# Patient Record
Sex: Male | Born: 1970 | Race: White | Hispanic: No | Marital: Single | Smoking: Never smoker
Health system: Southern US, Community
[De-identification: ages and names within clinical notes are randomized; demographics above are authoritative.]

## PROBLEM LIST (undated history)

## (undated) DIAGNOSIS — K635 Polyp of colon: Secondary | ICD-10-CM

## (undated) DIAGNOSIS — K219 Gastro-esophageal reflux disease without esophagitis: Secondary | ICD-10-CM

## (undated) DIAGNOSIS — E785 Hyperlipidemia, unspecified: Secondary | ICD-10-CM

## (undated) HISTORY — DX: Hyperlipidemia, unspecified: E78.5

## (undated) HISTORY — DX: Polyp of colon: K63.5

## (undated) HISTORY — DX: Gastro-esophageal reflux disease without esophagitis: K21.9

---

## 1998-12-10 ENCOUNTER — Emergency Department (HOSPITAL_COMMUNITY): Admission: EM | Admit: 1998-12-10 | Discharge: 1998-12-10 | Payer: Self-pay | Admitting: Emergency Medicine

## 2001-08-21 ENCOUNTER — Emergency Department (HOSPITAL_COMMUNITY): Admission: EM | Admit: 2001-08-21 | Discharge: 2001-08-21 | Payer: Self-pay | Admitting: Emergency Medicine

## 2001-08-21 ENCOUNTER — Encounter: Payer: Self-pay | Admitting: Emergency Medicine

## 2003-06-18 ENCOUNTER — Emergency Department (HOSPITAL_COMMUNITY): Admission: EM | Admit: 2003-06-18 | Discharge: 2003-06-18 | Payer: Self-pay | Admitting: *Deleted

## 2004-05-19 ENCOUNTER — Ambulatory Visit: Payer: Self-pay | Admitting: Internal Medicine

## 2005-05-31 IMAGING — CR DG ANKLE COMPLETE 3+V*L*
2 series · 2 of 2 positions shown · non-contrast
Comparison: none

CLINICAL DATA: Pain and swelling after a twisting injury.  
 LEFT ANKLE (THREE VIEW)
 There is soft tissue anteriorly and laterally but there is no evidence of fracture or dislocation or other acute bony abnormality.  
 IMPRESSION
 No bony abnormality.

[view not recorded (1 of 2)]
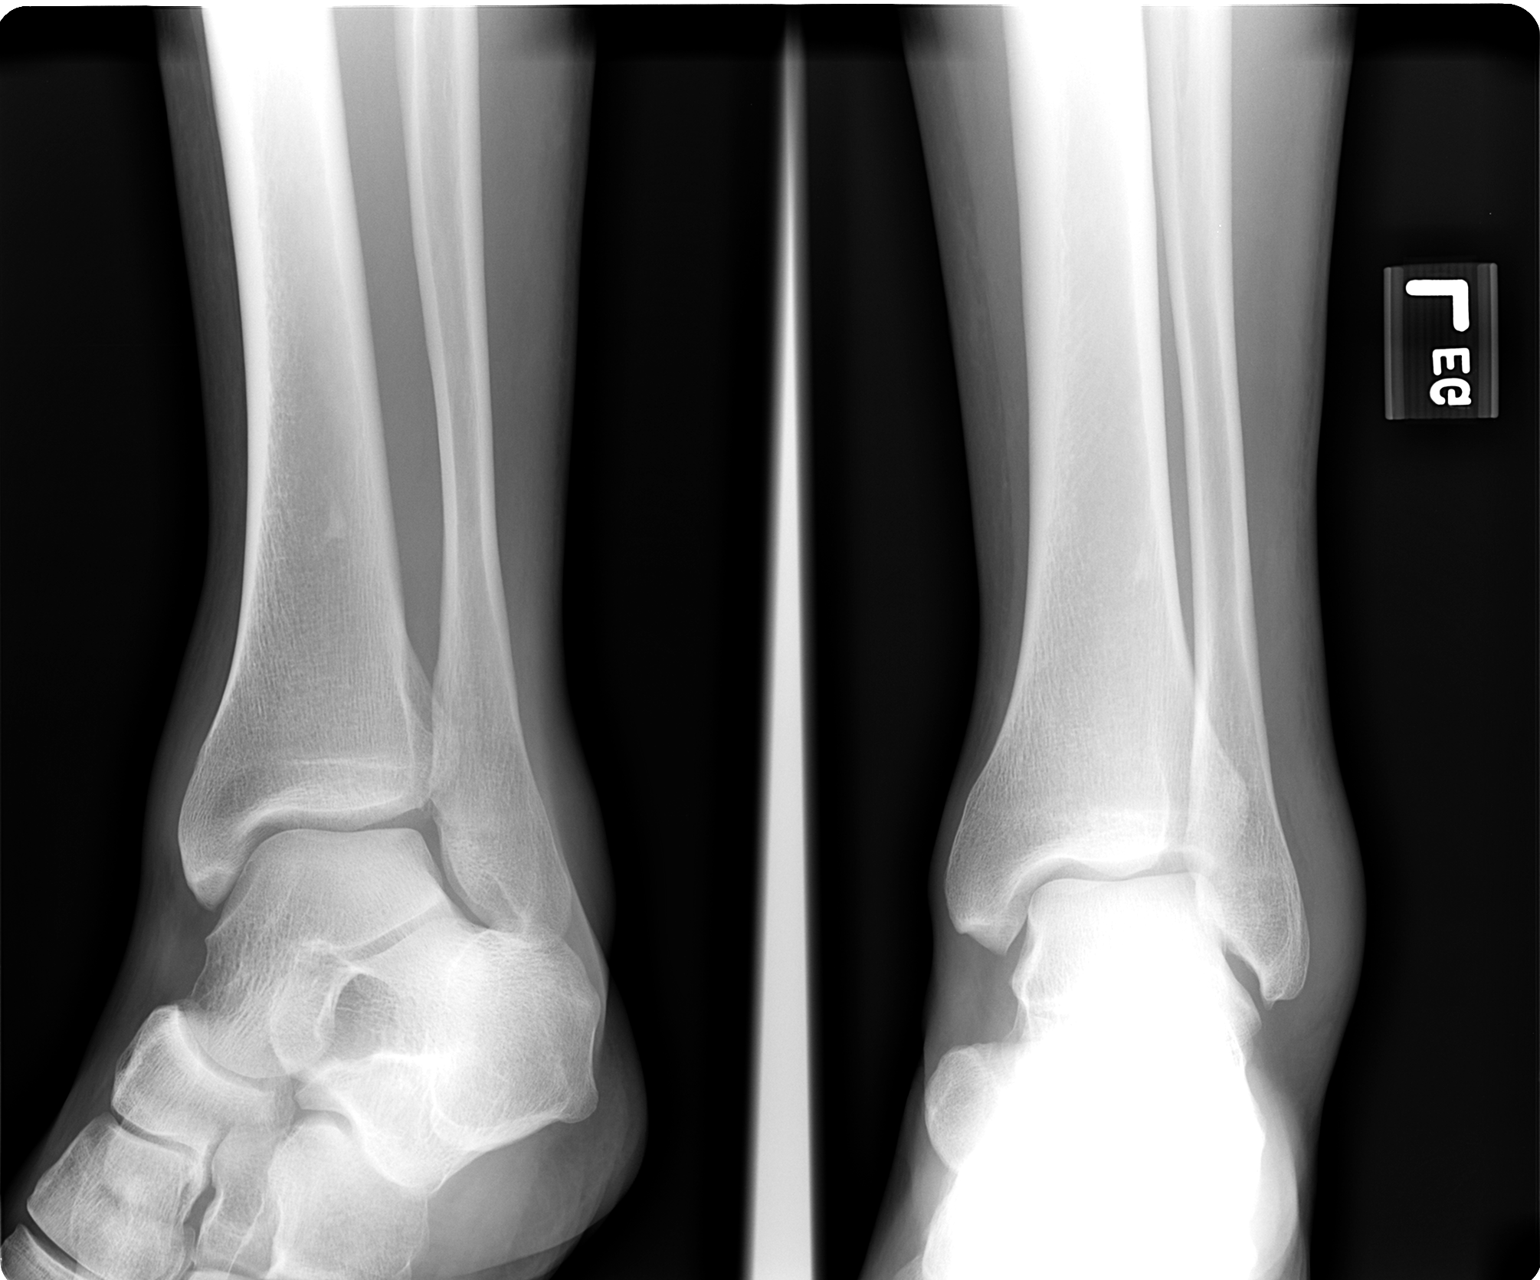

[view not recorded (2 of 2)]
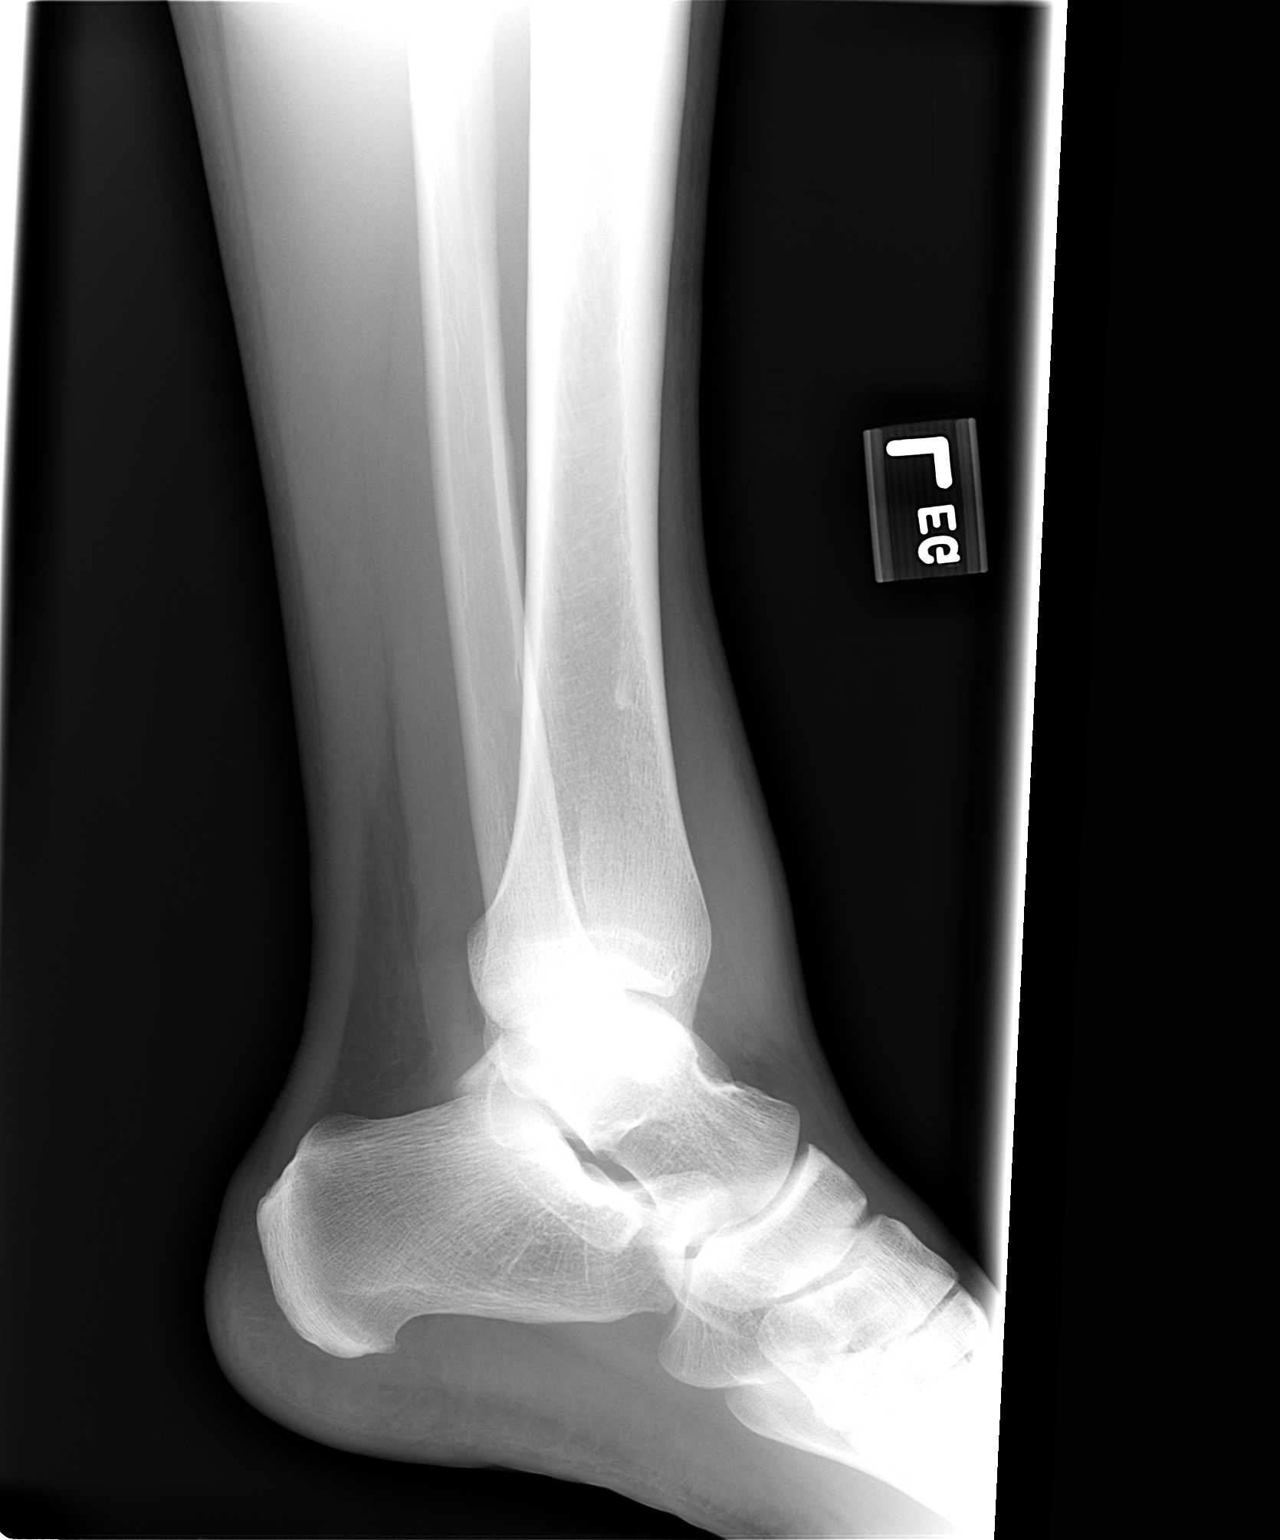

[2 of 2 positions shown; findings below may reference images not displayed]

## 2009-09-23 ENCOUNTER — Ambulatory Visit: Payer: Self-pay | Admitting: Internal Medicine

## 2009-09-23 LAB — CONVERTED CEMR LAB
ALT: 31 units/L (ref 0–53)
Alkaline Phosphatase: 63 units/L (ref 39–117)
Basophils Relative: 0.7 % (ref 0.0–3.0)
Bilirubin Urine: NEGATIVE
Bilirubin, Direct: 0.1 mg/dL (ref 0.0–0.3)
Blood in Urine, dipstick: NEGATIVE
Calcium: 9.4 mg/dL (ref 8.4–10.5)
Chloride: 104 meq/L (ref 96–112)
Cholesterol: 298 mg/dL — ABNORMAL HIGH (ref 0–200)
Creatinine, Ser: 1 mg/dL (ref 0.4–1.5)
Eosinophils Relative: 1.5 % (ref 0.0–5.0)
GFR calc non Af Amer: 88.38 mL/min (ref >60)
Glucose, Urine, Semiquant: NEGATIVE
Ketones, urine, test strip: NEGATIVE
Lymphocytes Relative: 30.1 % (ref 12.0–46.0)
Monocytes Relative: 8 % (ref 3.0–12.0)
Neutrophils Relative %: 59.7 % (ref 43.0–77.0)
Nitrite: NEGATIVE
Protein, U semiquant: NEGATIVE
RBC: 5.16 M/uL (ref 4.22–5.81)
Specific Gravity, Urine: 1.02
Total CHOL/HDL Ratio: 8
Total Protein: 6.8 g/dL (ref 6.0–8.3)
Triglycerides: 495 mg/dL — ABNORMAL HIGH (ref 0.0–149.0)
Urobilinogen, UA: 0.2
VLDL: 99 mg/dL — ABNORMAL HIGH (ref 0.0–40.0)
WBC Urine, dipstick: NEGATIVE
WBC: 7.2 10*3/uL (ref 4.5–10.5)
pH: 5

## 2009-10-15 ENCOUNTER — Ambulatory Visit: Payer: Self-pay | Admitting: Internal Medicine

## 2009-10-15 ENCOUNTER — Encounter: Payer: Self-pay | Admitting: Internal Medicine

## 2009-10-15 DIAGNOSIS — R040 Epistaxis: Secondary | ICD-10-CM | POA: Insufficient documentation

## 2009-10-15 DIAGNOSIS — D485 Neoplasm of uncertain behavior of skin: Secondary | ICD-10-CM | POA: Insufficient documentation

## 2009-10-15 DIAGNOSIS — E781 Pure hyperglyceridemia: Secondary | ICD-10-CM | POA: Insufficient documentation

## 2009-10-15 DIAGNOSIS — R209 Unspecified disturbances of skin sensation: Secondary | ICD-10-CM | POA: Insufficient documentation

## 2009-10-29 ENCOUNTER — Encounter: Payer: Self-pay | Admitting: Internal Medicine

## 2009-10-30 ENCOUNTER — Ambulatory Visit: Payer: Self-pay

## 2009-10-30 ENCOUNTER — Encounter: Payer: Self-pay | Admitting: Internal Medicine

## 2009-11-03 ENCOUNTER — Encounter: Payer: Self-pay | Admitting: Internal Medicine

## 2009-11-03 ENCOUNTER — Encounter: Payer: Self-pay | Admitting: *Deleted

## 2010-01-20 ENCOUNTER — Ambulatory Visit: Admit: 2010-01-20 | Payer: Self-pay | Admitting: Internal Medicine

## 2010-02-17 NOTE — Letter (Signed)
Summary: Generic Letter  [place] [facility]  [address] [place], [place] [address]   Phone: [phone]  Fax: [phone]    11/03/2009  Sean Branch [address] [place], [place]  [address]  Dear [name],  Your evaluation of the artery in you leg was normal. If you have any questions, please give me a call at [phone].         Sincerely,   Tor Netters, CMA (AAMA)
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
## 2010-02-17 NOTE — Miscellaneous (Signed)
Summary: Orders Update  Clinical Lists Changes  Orders: Added new Test order of Arterial Duplex Lower Extremity (Arterial Duplex Low) - Signed 

## 2010-02-17 NOTE — Letter (Signed)
Summary: Would not schedule [facility]  Would not schedule [facility]   Imported By: Maryln Gottron 11/11/2009 09:36:23  _____________________________________________________________________  External Attachment:    Type:   Image     Comment:   External Document

## 2010-08-13 ENCOUNTER — Telehealth: Payer: Self-pay | Admitting: Internal Medicine

## 2010-08-13 NOTE — Telephone Encounter (Signed)
Pt wife called 7/24 pt brother was dx with prostate CA. Pt needs prostate exam. Pt didn't know if Dr. Fabian Sharp could do exam or if he would need to be referred. Please contact wife.

## 2010-08-14 NOTE — Telephone Encounter (Signed)
Left message on machine for patient

## 2010-08-14 NOTE — Telephone Encounter (Signed)
Usually rectal /prostate exam is done in check up .  PSA only done with counseling.  So we can do the exam.  But If he wants a consultation about the best way to monitor for risk he can discuss this with a urologist.

## 2010-08-17 NOTE — Telephone Encounter (Signed)
Spoke with wife

## 2011-06-02 ENCOUNTER — Encounter: Payer: Self-pay | Admitting: Family Medicine

## 2011-06-02 ENCOUNTER — Ambulatory Visit (INDEPENDENT_AMBULATORY_CARE_PROVIDER_SITE_OTHER): Payer: BC Managed Care – PPO | Admitting: Family Medicine

## 2011-06-02 VITALS — BP 130/84 | Temp 98.3°F | Wt 188.0 lb

## 2011-06-02 DIAGNOSIS — M436 Torticollis: Secondary | ICD-10-CM

## 2011-06-02 MED ORDER — CYCLOBENZAPRINE HCL 10 MG PO TABS: 10.0000 mg | ORAL_TABLET | Freq: Three times a day (TID) | ORAL | Status: AC | PRN

## 2011-06-02 NOTE — Patient Instructions (Addendum)
Torticollis, Acute You have suddenly (acutely) developed a twisted neck (torticollis). This is usually a self-limited condition. CAUSES  Acute torticollis may be caused by malposition, trauma or infection. Most commonly, acute torticollis is caused by sleeping in an awkward position. Torticollis may also be caused by the flexion, extension or twisting of the neck muscles beyond their normal position. Sometimes, the exact cause may not be known. SYMPTOMS  Usually, there is pain and limited movement of the neck. Your neck may twist to one side. DIAGNOSIS  The diagnosis is often made by physical examination. X-rays, CT scans or MRIs may be done if there is a history of trauma or concern of infection. TREATMENT  For a common, stiff neck that develops during sleep, treatment is focused on relaxing the contracted neck muscle. Medications (including shots) may be used to treat the problem. Most cases resolve in several days. Torticollis usually responds to conservative physical therapy. If left untreated, the shortened and spastic neck muscle can cause deformities in the face and neck. Rarely, surgery is required. HOME CARE INSTRUCTIONS   Use over-the-counter and prescription medications as directed by your caregiver.   Do stretching exercises and massage the neck as directed by your caregiver.   Follow up with physical therapy if needed and as directed by your caregiver.  SEEK IMMEDIATE MEDICAL CARE IF:   You develop difficulty breathing or noisy breathing (stridor).   You drool, develop trouble swallowing or have pain with swallowing.   You develop numbness or weakness in the hands or feet.   You have changes in speech or vision.   You have problems with urination or bowel movements.   You have difficulty walking.   You have a fever.   You have increased pain.  MAKE SURE YOU:   Understand these instructions.   Will watch your condition.   Will get help right away if you are not  doing well or get worse.  Document Released: 01/02/2000 Document Revised: 12/24/2010 Document Reviewed: 02/12/2009 [facility] Patient Information 2012 [place], [place].  Continue with heat and muscle massage Touch base in 2-3 days if not improving.

## 2011-06-02 NOTE — Progress Notes (Signed)
  Subjective:    Patient ID: Sean Branch, male    DOB: [birth year], 41 y.o.   MRN: [id number]  HPI  Acute visit. Sore neck. Left-sided. Started Tuesday. Monday night he fell asleep on the couch. Denies any recent injury. He has had intense spasm muscles left side of neck. Pain with any neck movement. Denies any infectious symptoms such as fever chills. No sore throat. He's tried heat and ice along with Aleve without relief. Similar process several years ago. No radiculopathy symptoms   Review of Systems  Constitutional: Negative for fever, chills and fatigue.  HENT: Negative for sore throat.   Hematological: Negative for adenopathy.       Objective:   Physical Exam  Constitutional: He appears well-developed and well-nourished.  HENT:  Mouth/Throat: Oropharynx is clear and moist.  Neck: No thyromegaly present.       Patient has palpable muscle spasm and tension left side of neck. Trapezius tension less than paracervical muscular tension. No spinal tenderness.  Lymphadenopathy:    He has no cervical adenopathy.  Neurological: He has normal reflexes.       Full-strength upper extremities          Assessment & Plan:  Torticollis. Suspect related to malposition. No suspicion for infectious origin. Try continued heat, Flexeril 10 mg each bedtime. Muscle massage. Consider physical therapy for not improving over the next few days

## 2014-08-31 ENCOUNTER — Emergency Department (HOSPITAL_COMMUNITY): Payer: BLUE CROSS/BLUE SHIELD

## 2014-08-31 ENCOUNTER — Encounter (HOSPITAL_COMMUNITY): Payer: Self-pay

## 2014-08-31 DIAGNOSIS — R079 Chest pain, unspecified: Secondary | ICD-10-CM | POA: Insufficient documentation

## 2014-08-31 LAB — CBC
HEMATOCRIT: 46 % (ref 39.0–52.0)
Hemoglobin: 16 g/dL (ref 13.0–17.0)
MCH: 30.9 pg (ref 26.0–34.0)
MCHC: 34.8 g/dL (ref 30.0–36.0)
MCV: 89 fL (ref 78.0–100.0)
PLATELETS: 229 10*3/uL (ref 150–400)
RBC: 5.17 MIL/uL (ref 4.22–5.81)
RDW: 12.5 % (ref 11.5–15.5)
WBC: 7.1 10*3/uL (ref 4.0–10.5)

## 2014-08-31 LAB — BASIC METABOLIC PANEL
ANION GAP: 10 (ref 5–15)
BUN: 13 mg/dL (ref 6–20)
CHLORIDE: 101 mmol/L (ref 101–111)
CO2: 27 mmol/L (ref 22–32)
Calcium: 9.5 mg/dL (ref 8.9–10.3)
Creatinine, Ser: 1.05 mg/dL (ref 0.61–1.24)
GFR calc Af Amer: >60 mL/min (ref >60)
GLUCOSE: 93 mg/dL (ref 65–99)
Potassium: 3.8 mmol/L (ref 3.5–5.1)
SODIUM: 138 mmol/L (ref 135–145)

## 2014-08-31 LAB — I-STAT TROPONIN, ED: Troponin i, poc: 0 ng/mL (ref 0.00–0.08)

## 2014-08-31 NOTE — ED Notes (Signed)
Onset 4 days intermittant chest pain, radiating tor right side of neck and teeth.  Onset several weeks lower back pain.  Pt has had the same chest pain in the past when he over eats.  No shortness of breath, N/V.

## 2014-09-01 ENCOUNTER — Emergency Department (HOSPITAL_COMMUNITY)
Admission: EM | Admit: 2014-09-01 | Discharge: 2014-09-01 | Disposition: A | Discharge status: Home / Self Care | Payer: BLUE CROSS/BLUE SHIELD | Attending: Emergency Medicine | Admitting: Emergency Medicine

## 2014-09-01 DIAGNOSIS — R079 Chest pain, unspecified: Secondary | ICD-10-CM

## 2014-09-01 LAB — I-STAT TROPONIN, ED: TROPONIN I, POC: 0 ng/mL (ref 0.00–0.08)

## 2014-09-01 MED ORDER — GI COCKTAIL ~~LOC~~: 30.0000 mL | Freq: Once | ORAL | Status: DC

## 2014-09-01 NOTE — ED Notes (Signed)
Pt c/o intermittent chest pain x 4 days associated with mild shortness of breath and radiation into L arm and L teeth. States feeling "like wearing a tie that's too tight around my neck." Denies recent travel.

## 2014-09-01 NOTE — ED Provider Notes (Signed)
This chart was scribed for [place], DO by Forrestine Him, ED Scribe. This patient was seen in room D33C/D33C and the patient's care was started 12:52 AM.    TIME SEEN: 12:52 AM   CHIEF COMPLAINT:  Chief Complaint  Patient presents with  . Chest Pain     HPI:  HPI Comments: Sean Branch is a 44 y.o. male with no PMHx who presents to the Emergency Department complaining of intermittent, ongoing pressure like chest pain that radiates to the R side of neck and jaw x 4 days. States he feels like the pain as someone pulling his chest outward. Currently he is pain free but states he experienced an episode at time of check in. Pain is occasionally exacerbated with movement/exacerbated and alleviated with rest. No OTC medications or home remedies attempted prior to arrival. Denies any fever, chills, shortness of breath, nausea, vomiting. No previous history of cardiac catheterizations. Denies any history of blood clots. No recent long distance travel, hospitalization, surgery, trauma. Father has a history of heart disease and diabetes diagnosed at the age of 83. No known allergies to medications. Patient denies history of chronic catheterization or stress test. Pain does not appear to be pleuritic or exertional.  PCP: Lottie Dawson, MD   ROS: See HPI Constitutional: no fever  Eyes: no drainage  ENT: no runny nose   Cardiovascular:  Positive chest pain  Resp: no SOB  GI: no vomiting GU: no dysuria Integumentary: no rash  Allergy: no hives  Musculoskeletal: no leg swelling  Neurological: no slurred speech ROS otherwise negative  PAST MEDICAL HISTORY/PAST SURGICAL HISTORY:  History reviewed. No pertinent past medical history.  MEDICATIONS:  Prior to Admission medications   Not on File    ALLERGIES:  No Known Allergies  SOCIAL HISTORY:  Social History  Substance Use Topics  . Smoking status: Never Smoker   . Smokeless tobacco: Not on file  . Alcohol Use: 3.0 oz/week     5 Cans of beer per week    FAMILY HISTORY: History reviewed. No pertinent family history.  EXAM: BP 127/80 mmHg  Pulse 61  Temp(Src) 98 F (36.7 C) (Oral)  Resp 16  Ht 5\' 11"  (1.803 m)  Wt 185 lb (83.915 kg)  BMI 25.81 kg/m2  SpO2 100% CONSTITUTIONAL: Alert and oriented and responds appropriately to questions. Well-appearing; well-nourished HEAD: Normocephalic EYES: Conjunctivae clear, PERRL ENT: normal nose; no rhinorrhea; moist mucous membranes; pharynx without lesions noted NECK: Supple, no meningismus, no LAD  CARD: RRR; S1 and S2 appreciated; no murmurs, no clicks, no rubs, no gallops. 2 plus DP and radial pulses bilaterally  CHEST:  Chest wall is nontender to palpation without crepitus or ecchymosis or deformity RESP: Normal chest excursion without splinting or tachypnea; breath sounds clear and equal bilaterally; no wheezes, no rhonchi, no rales, no hypoxia or respiratory distress, speaking full sentences ABD/GI: Normal bowel sounds; non-distended; soft, non-tender, no rebound, no guarding, no peritoneal signs BACK:  The back appears normal and is non-tender to palpation, there is no CVA tenderness EXT: Normal ROM in all joints; non-tender to palpation; no edema; normal capillary refill; no cyanosis, no calf tenderness or swelling    SKIN: Normal color for age and race; warm NEURO: Moves all extremities equally, sensation to light touch intact diffusely, cranial nerves II through XII intact PSYCH: The patient's mood and manner are appropriate. Grooming and personal hygiene are appropriate.  MEDICAL DECISION MAKING: Patient here with chest pain. Currently chest pain-free. EKG  shows no ischemic changes, arrhythmia. No other associated symptoms. It does not appear to be exertional or pleuritic in nature. Labs unremarkable including negative troponin. Chest x-ray clear. Will obtain second troponin and continue to monitor patient. His heart score is 2. I feel that if his second  troponin is negative I recommend outpatient follow-up for outpatient stress test.  ED PROGRESS: Patient's second troponin is negative. He is still chest pain-free. I feel he is safe to be discharged home with close outpatient follow-up. We'll also provide with work note. Discussed with patient and wife customary in usual return precautions. He verbalizes understanding and discomfort with this plan.     EKG Interpretation  Date/Time:  Saturday August 31 2014 20:02:18 EDT Ventricular Rate:  73 PR Interval:  152 QRS Duration: 76 QT Interval:  358 QTC Calculation: 394 R Axis:   56 Text Interpretation:  Normal sinus rhythm Normal ECG No old tracing to compare Confirmed by Isabelly Kobler,  DO, Ariann Khaimov [phone]) on 09/01/2014 12:27:34 AM         EKG Interpretation  Date/Time:  Sunday September 01 2014 01:44:38 EDT Ventricular Rate:  57 PR Interval:  161 QRS Duration: 82 QT Interval:  391 QTC Calculation: 381 R Axis:   58 Text Interpretation:  Sinus rhythm Consider left atrial enlargement Abnormal R-wave progression, early transition No significant change since last tracing Confirmed by Erasmus Bistline,  DO, Boniface Goffe ([id number]) on 09/01/2014 1:47:36 AM        I personally performed the services described in this documentation, which was scribed in my presence. The recorded information has been reviewed and is accurate.   [place], DO 09/01/14 [phone]

## 2014-09-01 NOTE — Discharge Instructions (Signed)

## 2015-04-22 DIAGNOSIS — E7800 Pure hypercholesterolemia, unspecified: Secondary | ICD-10-CM | POA: Diagnosis not present

## 2015-04-22 DIAGNOSIS — R0789 Other chest pain: Secondary | ICD-10-CM | POA: Diagnosis not present

## 2015-12-25 DIAGNOSIS — R0789 Other chest pain: Secondary | ICD-10-CM | POA: Diagnosis not present

## 2015-12-25 DIAGNOSIS — E7800 Pure hypercholesterolemia, unspecified: Secondary | ICD-10-CM | POA: Diagnosis not present

## 2016-01-02 DIAGNOSIS — E7800 Pure hypercholesterolemia, unspecified: Secondary | ICD-10-CM | POA: Diagnosis not present

## 2016-01-02 DIAGNOSIS — R7303 Prediabetes: Secondary | ICD-10-CM | POA: Diagnosis not present

## 2016-02-03 DIAGNOSIS — X32XXXD Exposure to sunlight, subsequent encounter: Secondary | ICD-10-CM | POA: Diagnosis not present

## 2016-02-03 DIAGNOSIS — C44519 Basal cell carcinoma of skin of other part of trunk: Secondary | ICD-10-CM | POA: Diagnosis not present

## 2016-02-03 DIAGNOSIS — L570 Actinic keratosis: Secondary | ICD-10-CM | POA: Diagnosis not present

## 2016-02-03 DIAGNOSIS — D225 Melanocytic nevi of trunk: Secondary | ICD-10-CM | POA: Diagnosis not present

## 2016-08-13 IMAGING — DX DG CHEST 2V
2 series · 2 of 2 positions shown · non-contrast
Comparison: None.

CLINICAL DATA: 44-year-old male with chest pain for 3 days. Initial
encounter.

EXAM:
CHEST  2 VIEW

[chest pa]
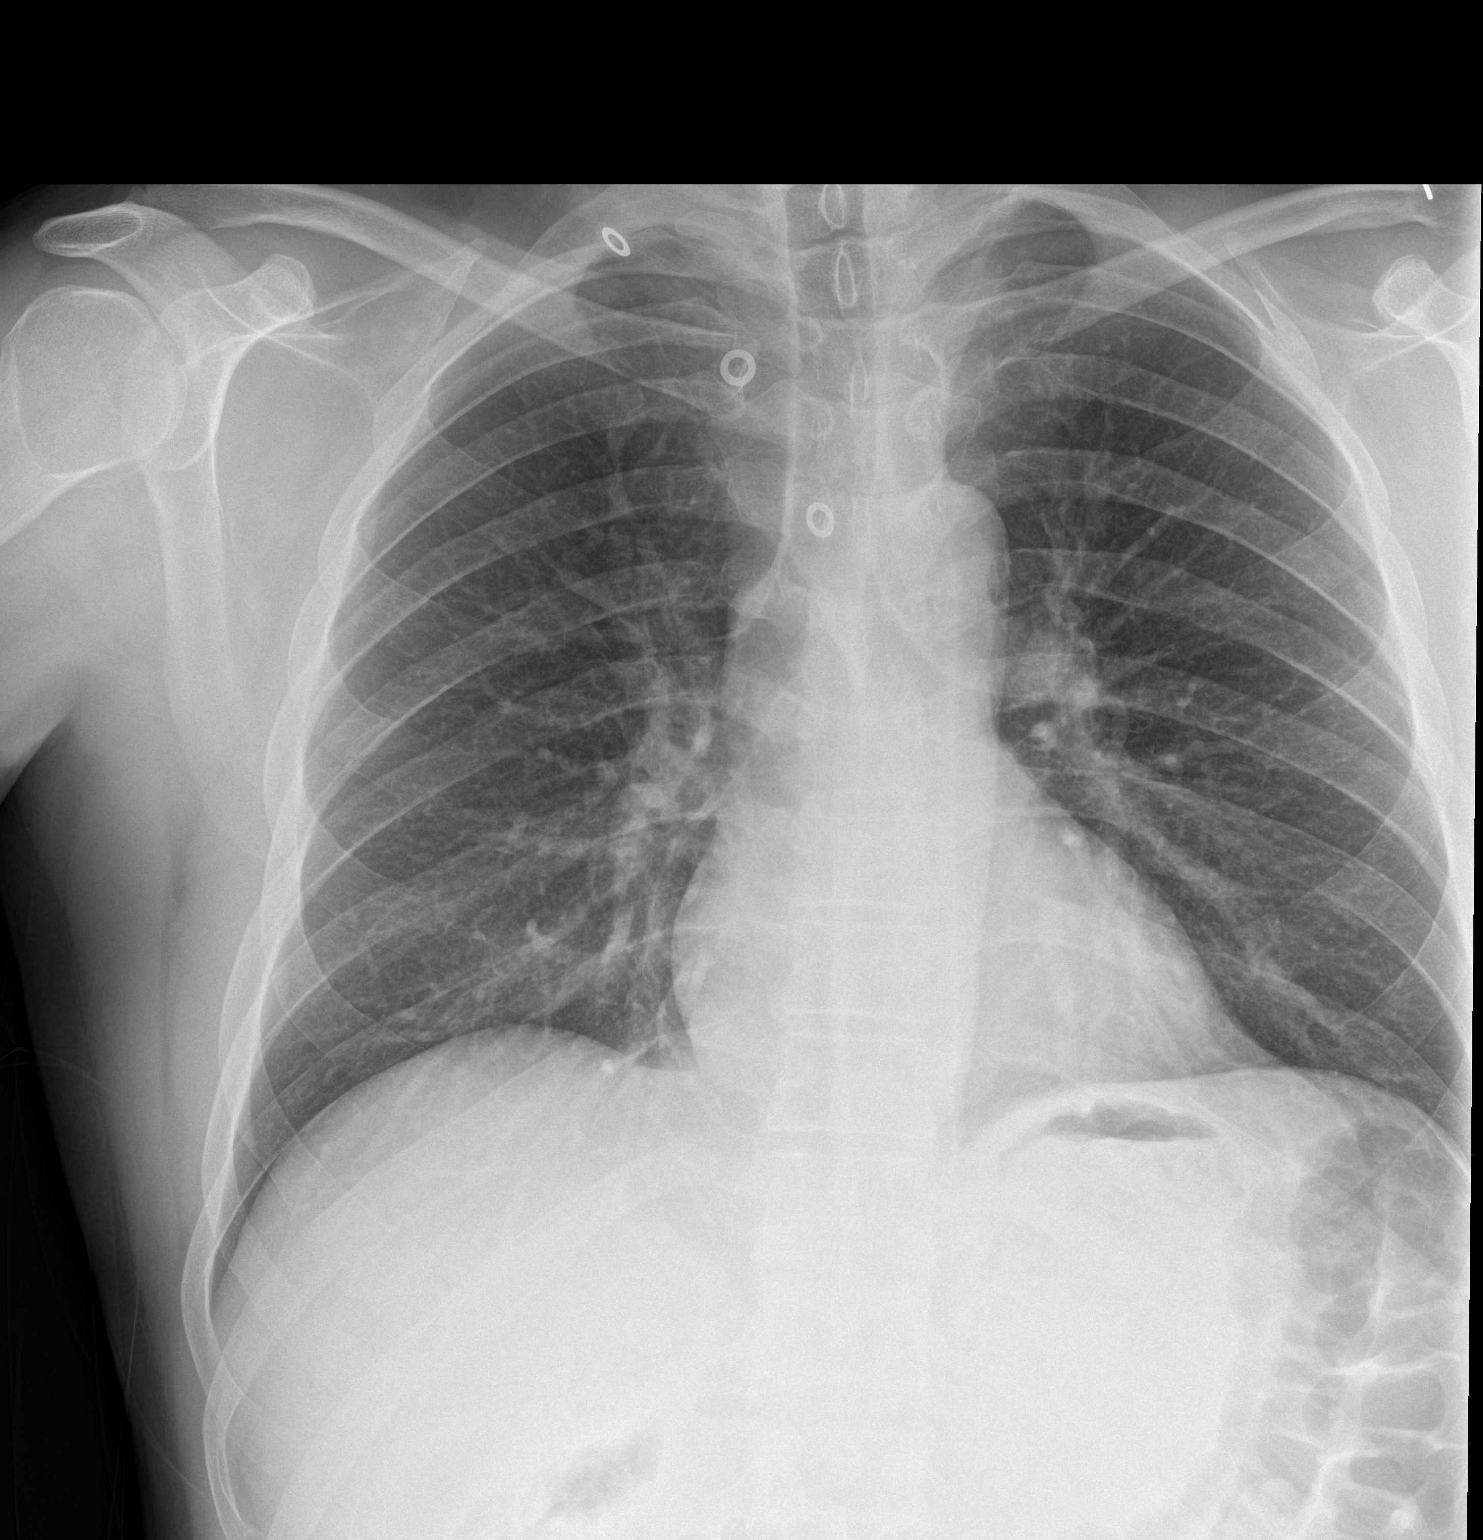

[chest lat]
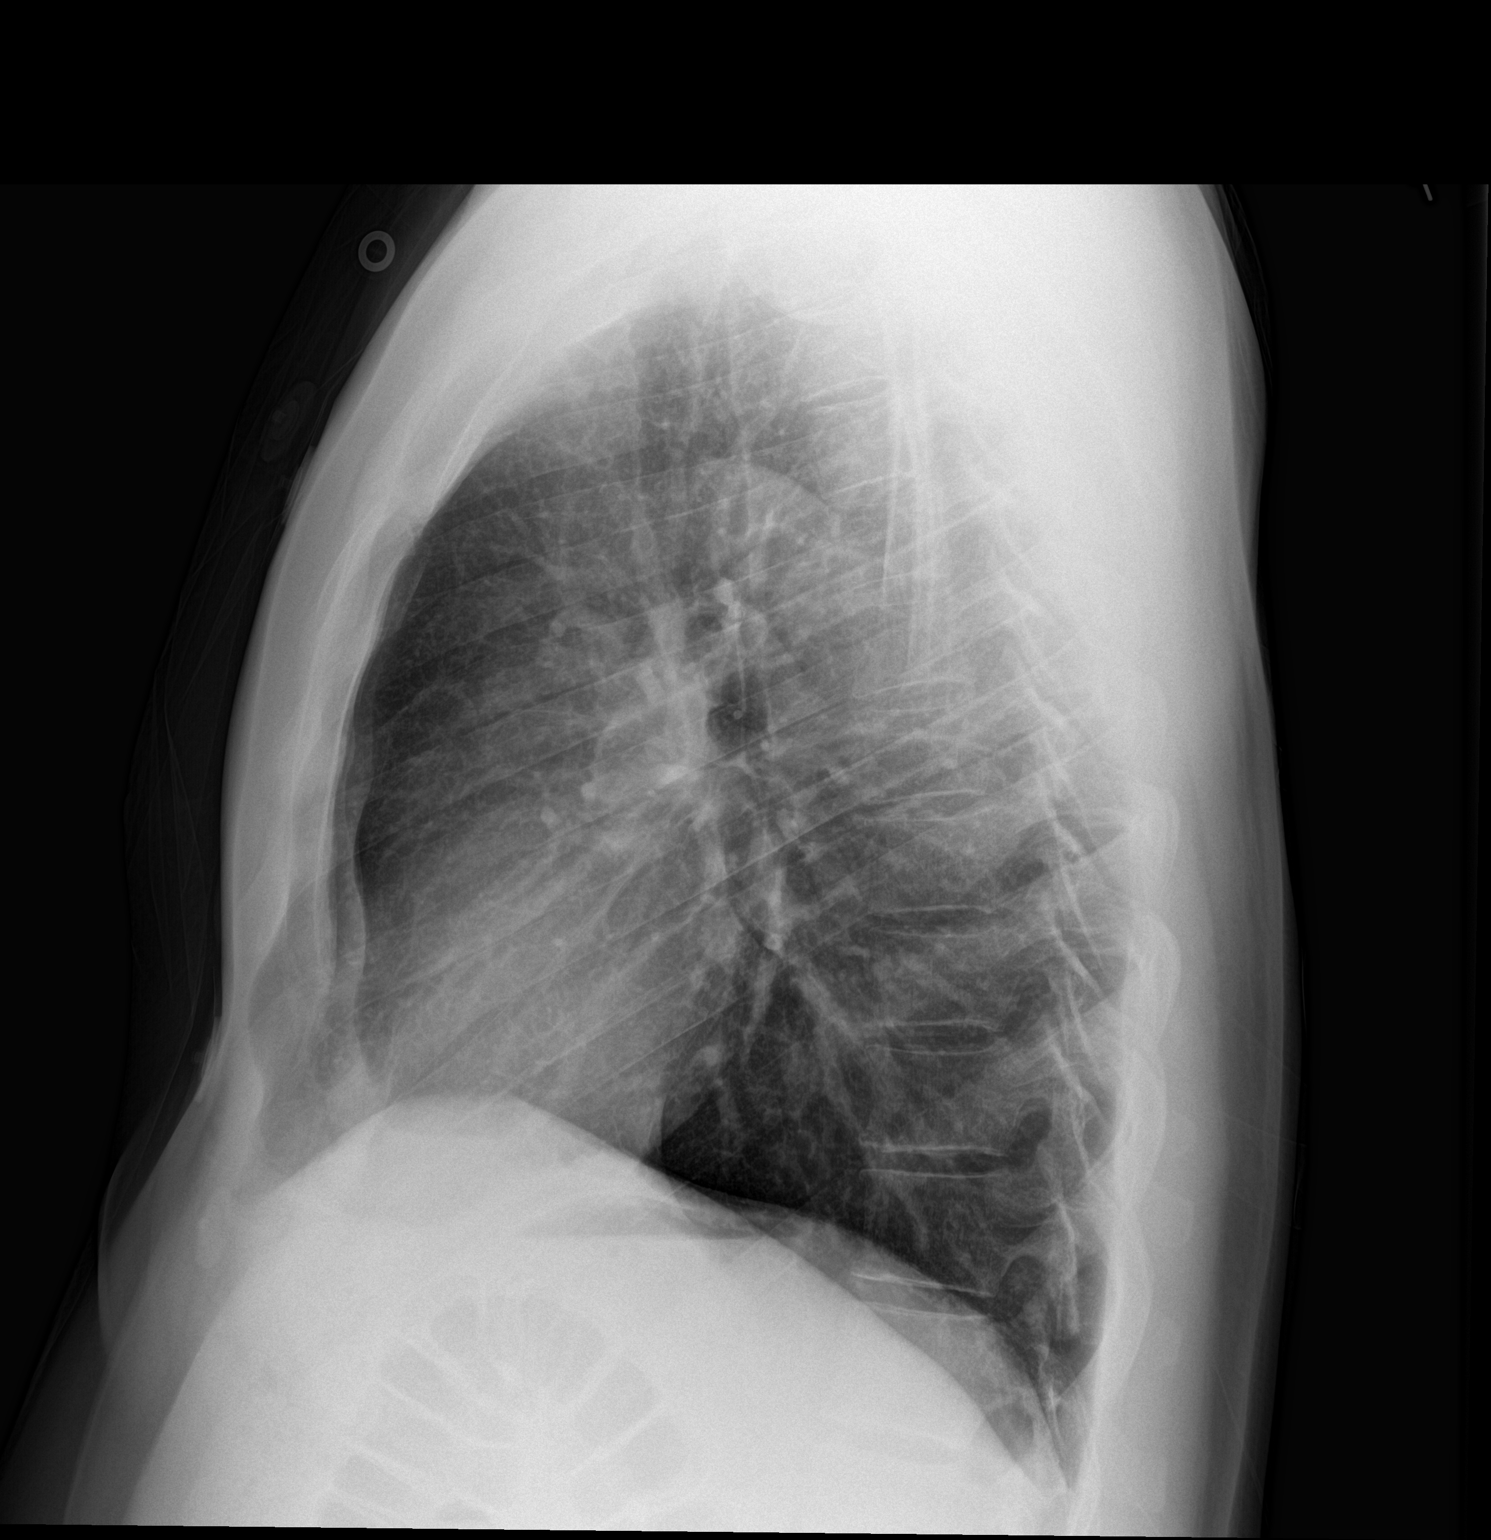

[2 of 2 positions shown; findings below may reference images not displayed]

FINDINGS: Circular external artifact projects about the right upper chest.
Normal lung volumes. Normal cardiac size and mediastinal contours.
Visualized tracheal air column is within normal limits. The lungs
are clear. No pneumothorax or effusion. No osseous abnormality
identified.
IMPRESSION: Negative, no acute cardiopulmonary abnormality.

## 2016-12-31 DIAGNOSIS — E7800 Pure hypercholesterolemia, unspecified: Secondary | ICD-10-CM | POA: Diagnosis not present

## 2016-12-31 DIAGNOSIS — R0789 Other chest pain: Secondary | ICD-10-CM | POA: Diagnosis not present

## 2017-01-13 DIAGNOSIS — E782 Mixed hyperlipidemia: Secondary | ICD-10-CM | POA: Diagnosis not present

## 2017-01-13 DIAGNOSIS — R7303 Prediabetes: Secondary | ICD-10-CM | POA: Diagnosis not present

## 2017-05-06 DIAGNOSIS — R0789 Other chest pain: Secondary | ICD-10-CM | POA: Diagnosis not present

## 2017-05-06 DIAGNOSIS — R7303 Prediabetes: Secondary | ICD-10-CM | POA: Diagnosis not present

## 2017-05-06 DIAGNOSIS — E782 Mixed hyperlipidemia: Secondary | ICD-10-CM | POA: Diagnosis not present

## 2017-07-01 DIAGNOSIS — E782 Mixed hyperlipidemia: Secondary | ICD-10-CM | POA: Diagnosis not present

## 2017-07-01 DIAGNOSIS — E7800 Pure hypercholesterolemia, unspecified: Secondary | ICD-10-CM | POA: Diagnosis not present

## 2017-07-11 DIAGNOSIS — E782 Mixed hyperlipidemia: Secondary | ICD-10-CM | POA: Diagnosis not present

## 2018-06-22 ENCOUNTER — Encounter: Payer: Self-pay | Admitting: Family Medicine

## 2018-06-22 DIAGNOSIS — D128 Benign neoplasm of rectum: Secondary | ICD-10-CM | POA: Diagnosis not present

## 2018-06-22 DIAGNOSIS — D122 Benign neoplasm of ascending colon: Secondary | ICD-10-CM | POA: Diagnosis not present

## 2018-06-22 DIAGNOSIS — K621 Rectal polyp: Secondary | ICD-10-CM | POA: Diagnosis not present

## 2018-06-22 DIAGNOSIS — Z800 Family history of malignant neoplasm of digestive organs: Secondary | ICD-10-CM | POA: Diagnosis not present

## 2018-06-22 DIAGNOSIS — Z1211 Encounter for screening for malignant neoplasm of colon: Secondary | ICD-10-CM | POA: Diagnosis not present

## 2018-06-22 DIAGNOSIS — D125 Benign neoplasm of sigmoid colon: Secondary | ICD-10-CM | POA: Diagnosis not present

## 2018-06-22 DIAGNOSIS — D124 Benign neoplasm of descending colon: Secondary | ICD-10-CM | POA: Diagnosis not present

## 2018-06-26 DIAGNOSIS — K625 Hemorrhage of anus and rectum: Secondary | ICD-10-CM | POA: Diagnosis not present

## 2019-03-14 DIAGNOSIS — C4401 Basal cell carcinoma of skin of lip: Secondary | ICD-10-CM | POA: Diagnosis not present

## 2019-03-14 DIAGNOSIS — D485 Neoplasm of uncertain behavior of skin: Secondary | ICD-10-CM | POA: Diagnosis not present

## 2019-03-14 DIAGNOSIS — D225 Melanocytic nevi of trunk: Secondary | ICD-10-CM | POA: Diagnosis not present

## 2019-04-02 ENCOUNTER — Other Ambulatory Visit: Payer: Self-pay

## 2019-04-02 NOTE — Progress Notes (Signed)
Chief Complaint  Patient presents with  . Annual Exam    HPI: Patient  Sean Branch  49 y.o. comes in today for Preventive Health Care visit  Last seen 2-13 so reestablishing  Her for pv cpx   7 polyps removed to repeat in 2 years. Brother passed from colon cancer  Had post procedure bleed n and anemia 9.9 but   Stopped  And no bleeding   Father  Had   Cad    Passed at 19   Renal failure vascular disease . And had dm.  Mi about late 69s   Had seen cards once  Was on atorva 18 and then 38 was supposed to be on vascepa.  Off a 1.5 .years or so   Stress test.   3-4 years ago .   Had no known se of the statin  Felt could do med via primary care as needed   Sees derm had a few  [place]zen recnetly  Disc shingrix vaccine  Health Maintenance  Topic Date Due  . HIV Screening  Never done  . TETANUS/TDAP  01/19/2016  . INFLUENZA VACCINE  Never done  . 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 GI /GU: No adominal pain, vomiting, change in bowel habits. No blood in the stool. No significant GU symptoms. SKIN/HEME: ,no acute skin rashes suspicious lesions or bleeding. No lymphadenopathy, nodules, masses.  NEURO/ PSYCH:  No neurologic signs such as weakness numbness. No depression anxiety. IMM/ Allergy: No unusual infections.  Allergy .   REST of 12 system review negative except as per HPI   Past Medical History:  Diagnosis Date  . Colon polyps    2020    History reviewed. No pertinent  surgical history.  History reviewed. No pertinent family history.  Social History   Socioeconomic History  . Marital status: Single    Spouse name: Not on file  . Number of children: Not on file  . Years of education: Not on file  . Highest education level: Not on file  Occupational History  . Not on file  Tobacco Use  . Smoking status: Never Smoker  . Smokeless tobacco: Never Used  Substance and Sexual Activity  . Alcohol use: Yes    Alcohol/week: 5.0 standard drinks    Types: 5 Cans of beer per week  . Drug use: No  . Sexual activity: Not on file  Other Topics Concern  . Not on file  Social History Narrative   hh of 2  Children at school or out of home   Neg td  etoh 10 per week    Pet dog    Social Determinants of Health   Financial Resource Strain:   . Difficulty of Paying Living Expenses:   Food Insecurity:   . Worried About Charity fundraiser in the Last Year:   . [employer] of  Food in the Last Year:   Transportation Needs:   . Film/video editor (Medical):   Marland Kitchen Lack of Transportation (Non-Medical):   Physical Activity:   . Days of Exercise per Week:   . Minutes of Exercise per Session:   Stress:   . Feeling of Stress :   Social Connections:   . Frequency of Communication with Friends and Family:   . Frequency of Social Gatherings with Friends and Family:   . Attends Religious Services:   . Active Member of Clubs or Organizations:   . Attends Archivist Meetings:   Marland Kitchen Marital Status:     Outpatient Medications Prior to Visit  Medication Sig Dispense Refill  . Naproxen Sodium (ALEVE PO) Take 2 tablets by mouth daily as needed (for headach/pain).     No facility-administered medications prior to visit.     EXAM:  BP 110/70   Pulse 80   Temp 97.9 F (36.6 C) (Other (Comment))   Ht 5' 10.75" (1.797 m)   Wt 195 lb (88.5 kg)   SpO2 97%   BMI 27.39 kg/m   Body mass index is 27.39 kg/m. Wt Readings from Last 3 Encounters:  04/03/19  195 lb (88.5 kg)  08/31/14 185 lb (83.9 kg)  06/02/11 188 lb (85.3 kg)    Physical Exam: Vital signs reviewed [id number] is a well-developed well-nourished alert cooperative    who appearsr stated age in no acute distress.  HEENT: normocephalic atraumatic , Eyes: PERRL EOM's full, conjunctiva clear, Nares: paten,t no deformity discharge or tenderness., Ears: no deformity EAC's 1+ wax  TMs with normal landmarks. Mouth: clear OP, masked air. NECK: supple without masses, thyromegaly or bruits. CHEST/PULM:  Clear to auscultation and percussion breath sounds equal no wheeze , rales or rhonchi. No chest wall deformities or tenderness. CV: PMI is nondisplaced, S1 S2 no gallops, murmurs, rubs. Peripheral pulses are full without delay.No JVD .  ABDOMEN: Bowel sounds normal nontender  No guard or rebound, no hepato splenomegal no CVA tenderness.   Extremtities:  No clubbing cyanosis or edema, no acute joint swelling or redness no focal atrophy NEURO:  Oriented x3, cranial nerves 3-12 appear to be intact, no obvious focal weakness,gait within normal limits no abnormal reflexes or asymmetrical SKIN: No acute rashes normal turgor, color, no bruising or petechiae. No acute findings   PSYCH: Oriented, good eye contact, no obvious depression anxiety, cognition and judgment appear normal. LN: no cervical axillary inguinal adenopathy  Lab Results  Component Value Date   WBC 7.1 08/31/2014   HGB 16.0 08/31/2014   HCT 46.0 08/31/2014   PLT 229 08/31/2014   GLUCOSE 93 08/31/2014   CHOL 298 (H) 09/23/2009   TRIG 495.0 (H) 09/23/2009   HDL 37.90 (L) 09/23/2009   LDLDIRECT 149.1 09/23/2009   ALT 31 09/23/2009   AST 22 09/23/2009   NA 138 08/31/2014   K 3.8 08/31/2014   CL 101 08/31/2014   CREATININE 1.05 08/31/2014   BUN 13 08/31/2014   CO2 27 08/31/2014   TSH 2.28 09/23/2009    BP Readings from Last 3 Encounters:  04/03/19 110/70  09/01/14 123/86  06/02/11 130/84    Lab plan  reviewed with  patient   ASSESSMENT AND PLAN:  Discussed the following assessment and plan:    ICD-10-CM   1. Visit for preventive health examination  123456 Basic metabolic panel    CBC with Differential/Platelet    Hepatic function panel    Lipid panel  TSH    PSA  2. Hyperlipidemia, unspecified hyperlipidemia type  99991111 Basic metabolic panel    CBC with Differential/Platelet    Hepatic function panel    Lipid panel    TSH  3. Family history of ischemic heart disease  [id number] Basic metabolic panel    CBC with Differential/Platelet    Hepatic function panel    Lipid panel    TSH  4. Screening PSA (prostate specific antigen)  Z12.5 PSA  5. Family history of colon cancer  Z80.0    Return in about 1 year (around 04/02/2020) for depending on results  cpx . Labs and donates  blood in past assumed   Neg hiv psa Shared Decision Making Disc shingrix  Wants to delay cause  Of se  Similar to covid screening ?s for work  To sign up for my chart  Will prob advise going back on statin  Because of  hx and previous levels of lipids  Patient Care Team: Jeda Pardue, Standley Brooking, MD as PCP - General Richmond Campbell, MD as Consulting Physician (Gastroenterology) Patient Instructions   Health Maintenance Due  Topic Date Due  . HIV Screening  Never done  . TETANUS/TDAP  01/19/2016  . INFLUENZA VACCINE  Never done    Depression screen [facility] 2/9 04/03/2019  Decreased Interest 0  Down, Depressed, Hopeless 0  PHQ - 2 Score 0  Will notify you  of labs when available.  And then decide on lipid medication but may    Be  Helpful for prevention Can make appt for shingrix vaccine  ( no ov needed)   When you  Are ready   Health Maintenance, Male Adopting a healthy lifestyle and getting preventive care are important in promoting health and wellness. Ask your health care provider about:  The right schedule for you to have regular tests and exams.  Things you can do on your own to prevent diseases and keep yourself  healthy. What should I know about diet, weight, and exercise? Eat a healthy diet   Eat a diet that includes plenty of vegetables, fruits, low-fat dairy products, and lean protein.  Do not eat a lot of foods that are high in solid fats, added sugars, or sodium. Maintain a healthy weight Body mass index (BMI) is a measurement that can be used to identify possible weight problems. It estimates body fat based on height and weight. Your health care provider can help determine your BMI and help you achieve or maintain a healthy weight. Get regular exercise Get regular exercise. This is one of the most important things you can do for your health. Most adults should:  Exercise for at least 150 minutes each week. The exercise should increase your heart rate and make you sweat (moderate-intensity exercise).  Do strengthening exercises at least twice a week. This is in addition to the moderate-intensity exercise.  Spend less time sitting. Even light physical activity can be beneficial. Watch cholesterol and blood lipids Have your blood tested for lipids and cholesterol at 51 years of age, then have this test every 5 years. You may need to have your cholesterol levels checked more often if:  Your lipid or cholesterol levels are high.  You are older than 51 years of age.  You are at high risk for heart disease. What should I know about cancer screening? Many types of cancers can be detected early and may often be prevented. Depending on your health history and family history, you may need to  have cancer screening at various ages. This may include screening for:  Colorectal cancer.  Prostate cancer.  Skin cancer.  Lung cancer. What should I know about heart disease, diabetes, and high blood pressure? Blood pressure and heart disease  High blood pressure causes heart disease and increases the risk of stroke. This is more likely to develop in people who have high blood pressure readings, are  of African descent, or are overweight.  Talk with your health care provider about your target blood pressure readings.  Have your blood pressure checked: ? Every 3-5 years if you are 16-47 years of age. ? Every year if you are 73 years old or older.  If you are between the ages of 13 and 56 and are a current or former smoker, ask your health care provider if you should have a one-time screening for abdominal aortic aneurysm (AAA). Diabetes Have regular diabetes screenings. This checks your fasting blood sugar level. Have the screening done:  Once every three years after age 88 if you are at a normal weight and have a low risk for diabetes.  More often and at a younger age if you are overweight or have a high risk for diabetes. What should I know about preventing infection? Hepatitis B If you have a higher risk for hepatitis B, you should be screened for this virus. Talk with your health care provider to find out if you are at risk for hepatitis B infection. Hepatitis C Blood testing is recommended for:  Everyone born from [birth year] through 1965.  Anyone with known risk factors for hepatitis C. Sexually transmitted infections (STIs)  You should be screened each year for STIs, including gonorrhea and chlamydia, if: ? You are sexually active and are younger than 51 years of age. ? You are older than 51 years of age and your health care provider tells you that you are at risk for this type of infection. ? Your sexual activity has changed since you were last screened, and you are at increased risk for chlamydia or gonorrhea. Ask your health care provider if you are at risk.  Ask your health care provider about whether you are at high risk for HIV. Your health care provider may recommend a prescription medicine to help prevent HIV infection. If you choose to take medicine to prevent HIV, you should first get tested for HIV. You should then be tested every 3 months for as long as you are taking  the medicine. Follow these instructions at home: Lifestyle  Do not use any products that contain nicotine or tobacco, such as cigarettes, e-cigarettes, and chewing tobacco. If you need help quitting, ask your health care provider.  Do not use street drugs.  Do not share needles.  Ask your health care provider for help if you need support or information about quitting drugs. Alcohol use  Do not drink alcohol if your health care provider tells you not to drink.  If you drink alcohol: ? Limit how much you have to 0-2 drinks a day. ? Be aware of how much alcohol is in your drink. In the [place] one drink equals one 12 oz bottle of beer (355 mL), one 5 oz glass of wine (148 mL), or one 1 oz glass of hard liquor (44 mL). General instructions  Schedule regular health, dental, and eye exams.  Stay current with your vaccines.  Tell your health care provider if: ? You often feel depressed. ? You have ever been abused or do not  feel safe at home. Summary  Adopting a healthy lifestyle and getting preventive care are important in promoting health and wellness.  Follow your health care provider's instructions about healthy diet, exercising, and getting tested or screened for diseases.  Follow your health care provider's instructions on monitoring your cholesterol and blood pressure. This information is not intended to replace advice given to you by your health care provider. Make sure you discuss any questions you have with your health care provider. Document Revised: 12/28/2017 Document Reviewed: 12/28/2017 Elsevier Patient Education  2020 [place] Annemarie Sebree M.D.

## 2019-04-03 ENCOUNTER — Ambulatory Visit (INDEPENDENT_AMBULATORY_CARE_PROVIDER_SITE_OTHER): Payer: BC Managed Care – PPO | Admitting: Internal Medicine

## 2019-04-03 ENCOUNTER — Encounter: Payer: Self-pay | Admitting: Internal Medicine

## 2019-04-03 VITALS — BP 110/70 | HR 80 | Temp 97.9°F | Ht 70.75 in | Wt 195.0 lb

## 2019-04-03 DIAGNOSIS — Z0000 Encounter for general adult medical examination without abnormal findings: Secondary | ICD-10-CM | POA: Diagnosis not present

## 2019-04-03 DIAGNOSIS — Z8249 Family history of ischemic heart disease and other diseases of the circulatory system: Secondary | ICD-10-CM | POA: Diagnosis not present

## 2019-04-03 DIAGNOSIS — Z125 Encounter for screening for malignant neoplasm of prostate: Secondary | ICD-10-CM | POA: Diagnosis not present

## 2019-04-03 DIAGNOSIS — Z800 Family history of malignant neoplasm of digestive organs: Secondary | ICD-10-CM

## 2019-04-03 DIAGNOSIS — E785 Hyperlipidemia, unspecified: Secondary | ICD-10-CM

## 2019-04-03 LAB — CBC WITH DIFFERENTIAL/PLATELET
Basophils Absolute: 0.1 10*3/uL (ref 0.0–0.1)
Basophils Relative: 1.1 % (ref 0.0–3.0)
Eosinophils Absolute: 0.1 10*3/uL (ref 0.0–0.7)
Eosinophils Relative: 1 % (ref 0.0–5.0)
HCT: 47.9 % (ref 39.0–52.0)
Hemoglobin: 16.3 g/dL (ref 13.0–17.0)
Lymphocytes Relative: 26.4 % (ref 12.0–46.0)
Lymphs Abs: 1.5 10*3/uL (ref 0.7–4.0)
MCHC: 34.1 g/dL (ref 30.0–36.0)
MCV: 90.6 fl (ref 78.0–100.0)
Monocytes Absolute: 0.6 10*3/uL (ref 0.1–1.0)
Monocytes Relative: 11.1 % (ref 3.0–12.0)
Neutro Abs: 3.5 10*3/uL (ref 1.4–7.7)
Neutrophils Relative %: 60.4 % (ref 43.0–77.0)
Platelets: 218 10*3/uL (ref 150.0–400.0)
RBC: 5.28 Mil/uL (ref 4.22–5.81)
RDW: 13.3 % (ref 11.5–15.5)
WBC: 5.7 10*3/uL (ref 4.0–10.5)

## 2019-04-03 LAB — HEPATIC FUNCTION PANEL
ALT: 25 U/L (ref 0–53)
AST: 21 U/L (ref 0–37)
Albumin: 4.2 g/dL (ref 3.5–5.2)
Alkaline Phosphatase: 59 U/L (ref 39–117)
Bilirubin, Direct: 0.1 mg/dL (ref 0.0–0.3)
Total Bilirubin: 0.6 mg/dL (ref 0.2–1.2)
Total Protein: 7 g/dL (ref 6.0–8.3)

## 2019-04-03 LAB — BASIC METABOLIC PANEL
BUN: 17 mg/dL (ref 6–23)
CO2: 28 mEq/L (ref 19–32)
Calcium: 9.5 mg/dL (ref 8.4–10.5)
Chloride: 101 mEq/L (ref 96–112)
Creatinine, Ser: 0.99 mg/dL (ref 0.40–1.50)
GFR: 79.73 mL/min (ref >60.00)
Glucose, Bld: 107 mg/dL — ABNORMAL HIGH (ref 70–99)
Potassium: 4.2 mEq/L (ref 3.5–5.1)
Sodium: 136 mEq/L (ref 135–145)

## 2019-04-03 LAB — PSA: PSA: 0.56 ng/mL (ref 0.10–4.00)

## 2019-04-03 LAB — LDL CHOLESTEROL, DIRECT: Direct LDL: 137 mg/dL

## 2019-04-03 LAB — LIPID PANEL
Cholesterol: 305 mg/dL — ABNORMAL HIGH (ref 0–200)
HDL: 36.3 mg/dL — ABNORMAL LOW (ref >39.00)
Total CHOL/HDL Ratio: 8
Triglycerides: 532 mg/dL — ABNORMAL HIGH (ref 0.0–149.0)

## 2019-04-03 LAB — TSH: TSH: 1.54 u[IU]/mL (ref 0.35–4.50)

## 2019-04-03 NOTE — Patient Instructions (Addendum)
Health Maintenance Due  Topic Date Due  . HIV Screening  Never done  . TETANUS/TDAP  01/19/2016  . INFLUENZA VACCINE  Never done    Depression screen [facility] 2/9 04/03/2019  Decreased Interest 0  Down, Depressed, Hopeless 0  PHQ - 2 Score 0  Will notify you  of labs when available.  And then decide on lipid medication but may    Be  Helpful for prevention Can make appt for shingrix vaccine  ( no ov needed)   When you  Are ready   Health Maintenance, Male Adopting a healthy lifestyle and getting preventive care are important in promoting health and wellness. Ask your health care provider about:  The right schedule for you to have regular tests and exams.  Things you can do on your own to prevent diseases and keep yourself healthy. What should I know about diet, weight, and exercise? Eat a healthy diet   Eat a diet that includes plenty of vegetables, fruits, low-fat dairy products, and lean protein.  Do not eat a lot of foods that are high in solid fats, added sugars, or sodium. Maintain a healthy weight Body mass index (BMI) is a measurement that can be used to identify possible weight problems. It estimates body fat based on height and weight. Your health care provider can help determine your BMI and help you achieve or maintain a healthy weight. Get regular exercise Get regular exercise. This is one of the most important things you can do for your health. Most adults should:  Exercise for at least 150 minutes each week. The exercise should increase your heart rate and make you sweat (moderate-intensity exercise).  Do strengthening exercises at least twice a week. This is in addition to the moderate-intensity exercise.  Spend less time sitting. Even light physical activity can be beneficial. Watch cholesterol and blood lipids Have your blood tested for lipids and cholesterol at 51 years of age, then have this test every 5 years. You may need to have your cholesterol levels checked  more often if:  Your lipid or cholesterol levels are high.  You are older than 51 years of age.  You are at high risk for heart disease. What should I know about cancer screening? Many types of cancers can be detected early and may often be prevented. Depending on your health history and family history, you may need to have cancer screening at various ages. This may include screening for:  Colorectal cancer.  Prostate cancer.  Skin cancer.  Lung cancer. What should I know about heart disease, diabetes, and high blood pressure? Blood pressure and heart disease  High blood pressure causes heart disease and increases the risk of stroke. This is more likely to develop in people who have high blood pressure readings, are of African descent, or are overweight.  Talk with your health care provider about your target blood pressure readings.  Have your blood pressure checked: ? Every 3-5 years if you are 29-52 years of age. ? Every year if you are 90 years old or older.  If you are between the ages of 61 and 85 and are a current or former smoker, ask your health care provider if you should have a one-time screening for abdominal aortic aneurysm (AAA). Diabetes Have regular diabetes screenings. This checks your fasting blood sugar level. Have the screening done:  Once every three years after age 71 if you are at a normal weight and have a low risk for diabetes.  More often and at a younger age if you are overweight or have a high risk for diabetes. What should I know about preventing infection? Hepatitis B If you have a higher risk for hepatitis B, you should be screened for this virus. Talk with your health care provider to find out if you are at risk for hepatitis B infection. Hepatitis C Blood testing is recommended for:  Everyone born from [birth year] through 1965.  Anyone with known risk factors for hepatitis C. Sexually transmitted infections (STIs)  You should be screened each  year for STIs, including gonorrhea and chlamydia, if: ? You are sexually active and are younger than 51 years of age. ? You are older than 51 years of age and your health care provider tells you that you are at risk for this type of infection. ? Your sexual activity has changed since you were last screened, and you are at increased risk for chlamydia or gonorrhea. Ask your health care provider if you are at risk.  Ask your health care provider about whether you are at high risk for HIV. Your health care provider may recommend a prescription medicine to help prevent HIV infection. If you choose to take medicine to prevent HIV, you should first get tested for HIV. You should then be tested every 3 months for as long as you are taking the medicine. Follow these instructions at home: Lifestyle  Do not use any products that contain nicotine or tobacco, such as cigarettes, e-cigarettes, and chewing tobacco. If you need help quitting, ask your health care provider.  Do not use street drugs.  Do not share needles.  Ask your health care provider for help if you need support or information about quitting drugs. Alcohol use  Do not drink alcohol if your health care provider tells you not to drink.  If you drink alcohol: ? Limit how much you have to 0-2 drinks a day. ? Be aware of how much alcohol is in your drink. In the [place] one drink equals one 12 oz bottle of beer (355 mL), one 5 oz glass of wine (148 mL), or one 1 oz glass of hard liquor (44 mL). General instructions  Schedule regular health, dental, and eye exams.  Stay current with your vaccines.  Tell your health care provider if: ? You often feel depressed. ? You have ever been abused or do not feel safe at home. Summary  Adopting a healthy lifestyle and getting preventive care are important in promoting health and wellness.  Follow your health care provider's instructions about healthy diet, exercising, and getting tested or  screened for diseases.  Follow your health care provider's instructions on monitoring your cholesterol and blood pressure. This information is not intended to replace advice given to you by your health care provider. Make sure you discuss any questions you have with your health care provider. Document Revised: 12/28/2017 Document Reviewed: 12/28/2017 Elsevier Patient Education  2020 [employer].

## 2019-04-03 NOTE — Progress Notes (Signed)
As suspected the cholesterol is very high as well as triglycerides  Blood sugar borderline but not diabetic range   Begin crestor 10 mg 1 po qd disp 90 refill x 1  Please send in medication to pharmacy  . Intensify lifestyle interventions. With exercise   to get the TG down . Plan fasting lipids  hg a1c and blood glucose in 3 months  please place orders   Send in ?s my chart when signed up

## 2019-05-25 ENCOUNTER — Telehealth: Payer: Self-pay | Admitting: Internal Medicine

## 2019-05-25 NOTE — Telephone Encounter (Signed)
Spoke to pt and advised to give it 24hrs and call the help line or call us back Monday. I advised pt that I do not see his Mychart was active. Pt also advised that once it is active all lab results etc will be there from [facility]. Pt verbalized understanding.

## 2019-05-25 NOTE — Telephone Encounter (Signed)
Pt just signed up for my chart and would like his lab results from 04-03-2019 sent to my chart

## 2019-05-29 ENCOUNTER — Other Ambulatory Visit: Payer: Self-pay

## 2019-05-29 DIAGNOSIS — E782 Mixed hyperlipidemia: Secondary | ICD-10-CM

## 2019-05-29 DIAGNOSIS — R7309 Other abnormal glucose: Secondary | ICD-10-CM

## 2019-05-29 MED ORDER — ROSUVASTATIN CALCIUM 10 MG PO TABS: 10.0000 mg | ORAL_TABLET | Freq: Every day | ORAL | 1 refills | Status: DC

## 2019-05-30 NOTE — Telephone Encounter (Signed)
Lab results were sent to patient yesterday through his MyChart.

## 2019-08-29 ENCOUNTER — Other Ambulatory Visit: Payer: BC Managed Care – PPO

## 2019-11-23 ENCOUNTER — Other Ambulatory Visit: Payer: Self-pay | Admitting: Internal Medicine

## 2020-01-26 DIAGNOSIS — Z1152 Encounter for screening for COVID-19: Secondary | ICD-10-CM | POA: Diagnosis not present

## 2020-06-25 DIAGNOSIS — S61421A Laceration with foreign body of right hand, initial encounter: Secondary | ICD-10-CM | POA: Diagnosis not present

## 2020-07-29 DIAGNOSIS — K648 Other hemorrhoids: Secondary | ICD-10-CM | POA: Diagnosis not present

## 2020-07-29 DIAGNOSIS — Z86010 Personal history of colonic polyps: Secondary | ICD-10-CM | POA: Diagnosis not present

## 2020-07-29 DIAGNOSIS — K635 Polyp of colon: Secondary | ICD-10-CM | POA: Diagnosis not present

## 2020-07-29 DIAGNOSIS — Z1211 Encounter for screening for malignant neoplasm of colon: Secondary | ICD-10-CM | POA: Diagnosis not present

## 2020-07-29 DIAGNOSIS — Z800 Family history of malignant neoplasm of digestive organs: Secondary | ICD-10-CM | POA: Diagnosis not present

## 2020-07-29 LAB — HM COLONOSCOPY

## 2020-07-30 ENCOUNTER — Encounter: Payer: Self-pay | Admitting: Internal Medicine

## 2021-06-23 NOTE — Progress Notes (Unsigned)
   New Patient Office Visit  Subjective    Patient ID: [name], male    DOB: 12/25/1970  Age: 51 y.o. MRN: [id number]  CC: No chief complaint on file.   HPI Sean Branch presents for new patient visit to establish care.  Introduced to Designer, jewellery role and practice setting.  All questions answered.  Discussed provider/patient relationship and expectations.   Outpatient Encounter Medications as of 06/24/2021  Medication Sig   rosuvastatin (CRESTOR) 10 MG tablet TAKE 1 TABLET(10 MG) BY MOUTH DAILY   No facility-administered encounter medications on file as of 06/24/2021.    Past Medical History:  Diagnosis Date   Colon polyps    2020    No past surgical history on file.  No family history on file.  Social History   Socioeconomic History   Marital status: Single    Spouse name: Not on file   Number of children: Not on file   Years of education: Not on file   Highest education level: Not on file  Occupational History   Not on file  Tobacco Use   Smoking status: Never   Smokeless tobacco: Never  Substance and Sexual Activity   Alcohol use: Yes    Alcohol/week: 5.0 standard drinks    Types: 5 Cans of beer per week   Drug use: No   Sexual activity: Not on file  Other Topics Concern   Not on file  Social History Narrative   hh of 2  Children at school or out of home   Neg td  etoh 10 per week    Pet dog    Social Determinants of Health   Financial Resource Strain: Not on file  Food Insecurity: Not on file  Transportation Needs: Not on file  Physical Activity: Not on file  Stress: Not on file  Social Connections: Not on file  Intimate Partner Violence: Not on file    ROS      Objective    There were no vitals taken for this visit.  Physical Exam  {Labs (Optional):23779}    Assessment & Plan:   Problem List Items Addressed This Visit   None   No follow-ups on file.   Sean Dancer, NP

## 2021-06-24 ENCOUNTER — Ambulatory Visit: Payer: BC Managed Care – PPO | Admitting: Nurse Practitioner

## 2021-06-24 ENCOUNTER — Encounter: Payer: Self-pay | Admitting: Nurse Practitioner

## 2021-06-24 VITALS — BP 133/82 | HR 73 | Temp 96.6°F | Ht 71.0 in | Wt 187.2 lb

## 2021-06-24 DIAGNOSIS — Z0000 Encounter for general adult medical examination without abnormal findings: Secondary | ICD-10-CM

## 2021-06-24 DIAGNOSIS — E782 Mixed hyperlipidemia: Secondary | ICD-10-CM

## 2021-06-24 DIAGNOSIS — R197 Diarrhea, unspecified: Secondary | ICD-10-CM

## 2021-06-24 DIAGNOSIS — H6123 Impacted cerumen, bilateral: Secondary | ICD-10-CM | POA: Diagnosis not present

## 2021-06-24 DIAGNOSIS — R7301 Impaired fasting glucose: Secondary | ICD-10-CM | POA: Insufficient documentation

## 2021-06-24 LAB — CBC WITH DIFFERENTIAL/PLATELET
Basophils Absolute: 0.1 10*3/uL (ref 0.0–0.1)
Basophils Relative: 1.1 % (ref 0.0–3.0)
Eosinophils Absolute: 0.1 10*3/uL (ref 0.0–0.7)
Eosinophils Relative: 1 % (ref 0.0–5.0)
HCT: 48.1 % (ref 39.0–52.0)
Hemoglobin: 15.8 g/dL (ref 13.0–17.0)
Lymphocytes Relative: 29.8 % (ref 12.0–46.0)
Lymphs Abs: 1.7 10*3/uL (ref 0.7–4.0)
MCHC: 32.8 g/dL (ref 30.0–36.0)
MCV: 89.6 fl (ref 78.0–100.0)
Monocytes Absolute: 0.5 10*3/uL (ref 0.1–1.0)
Monocytes Relative: 9.3 % (ref 3.0–12.0)
Neutro Abs: 3.4 10*3/uL (ref 1.4–7.7)
Neutrophils Relative %: 58.8 % (ref 43.0–77.0)
Platelets: 205 10*3/uL (ref 150.0–400.0)
RBC: 5.37 Mil/uL (ref 4.22–5.81)
RDW: 13.7 % (ref 11.5–15.5)
WBC: 5.8 10*3/uL (ref 4.0–10.5)

## 2021-06-24 LAB — COMPREHENSIVE METABOLIC PANEL
ALT: 25 U/L (ref 0–53)
AST: 19 U/L (ref 0–37)
Albumin: 4.3 g/dL (ref 3.5–5.2)
Alkaline Phosphatase: 62 U/L (ref 39–117)
BUN: 21 mg/dL (ref 6–23)
CO2: 25 mEq/L (ref 19–32)
Calcium: 9.6 mg/dL (ref 8.4–10.5)
Chloride: 104 mEq/L (ref 96–112)
Creatinine, Ser: 0.93 mg/dL (ref 0.40–1.50)
GFR: 94 mL/min (ref >60.00)
Glucose, Bld: 93 mg/dL (ref 70–99)
Potassium: 4.4 mEq/L (ref 3.5–5.1)
Sodium: 137 mEq/L (ref 135–145)
Total Bilirubin: 0.5 mg/dL (ref 0.2–1.2)
Total Protein: 6.9 g/dL (ref 6.0–8.3)

## 2021-06-24 LAB — LIPID PANEL
Cholesterol: 358 mg/dL — ABNORMAL HIGH (ref 0–200)
HDL: 32.7 mg/dL — ABNORMAL LOW (ref >39.00)
Total CHOL/HDL Ratio: 11
Triglycerides: 826 mg/dL — ABNORMAL HIGH (ref 0.0–149.0)

## 2021-06-24 LAB — LDL CHOLESTEROL, DIRECT: Direct LDL: 92 mg/dL

## 2021-06-24 LAB — HEMOGLOBIN A1C: Hgb A1c MFr Bld: 5.7 % (ref 4.6–6.5)

## 2021-06-24 NOTE — Assessment & Plan Note (Signed)
Glucose has been elevated in the past, check A1c today

## 2021-06-24 NOTE — Assessment & Plan Note (Signed)
History of elevated LDL and triglycerides. Will check fasting lipid panel today.

## 2021-06-24 NOTE — Patient Instructions (Signed)
It was great to see you!  We are checking your labs today and will let you know the results via mychart/phone.   Let's follow-up in 1  year depending on lab results, sooner if you have concerns.  If a referral was placed today, you will be contacted for an appointment. Please note that routine referrals can sometimes take up to 3-4 weeks to process. Please call our office if you haven't heard anything after this time frame.  Take care,  Vance Peper, NP

## 2021-06-24 NOTE — Assessment & Plan Note (Signed)
Ongoing diarrhea with watery and loose stools for the past 3 years since his first colonoscopy. He has increased the fiber in his diet along with metamucil and benefiber. Will check stool study for c-diff, O&P, and culture. Continue metamucil daily and drinking plenty of water. Follow-up based on lab results. He can also reach out to his GI provider.

## 2021-06-24 NOTE — Assessment & Plan Note (Signed)
Bilateral ears impacted with cerumen. He gave verbal consent for bilateral ear irrigation. He tolerated the procedure well with good results.

## 2021-06-25 ENCOUNTER — Other Ambulatory Visit: Payer: Self-pay

## 2021-06-25 ENCOUNTER — Other Ambulatory Visit: Payer: BC Managed Care – PPO

## 2021-06-25 DIAGNOSIS — R197 Diarrhea, unspecified: Secondary | ICD-10-CM

## 2021-06-26 MED ORDER — ROSUVASTATIN CALCIUM 20 MG PO TABS: 20.0000 mg | ORAL_TABLET | Freq: Every day | ORAL | 2 refills | Status: DC

## 2021-06-26 NOTE — Addendum Note (Signed)
Addended by: Vance Peper A on: 06/26/2021 07:44 AM   Modules accepted: Orders

## 2021-06-30 LAB — CDIFF NAA+O+P+STOOL CULTURE
E coli, Shiga toxin Assay: NEGATIVE
Toxigenic C. Difficile by PCR: NEGATIVE

## 2021-06-30 LAB — SPECIMEN STATUS REPORT

## 2021-07-01 ENCOUNTER — Encounter: Payer: Self-pay | Admitting: Nurse Practitioner

## 2021-10-08 ENCOUNTER — Other Ambulatory Visit: Payer: Self-pay | Admitting: Nurse Practitioner

## 2022-01-15 DIAGNOSIS — C4441 Basal cell carcinoma of skin of scalp and neck: Secondary | ICD-10-CM | POA: Diagnosis not present

## 2022-01-15 DIAGNOSIS — C44519 Basal cell carcinoma of skin of other part of trunk: Secondary | ICD-10-CM | POA: Diagnosis not present

## 2022-01-15 DIAGNOSIS — L570 Actinic keratosis: Secondary | ICD-10-CM | POA: Diagnosis not present

## 2022-01-15 DIAGNOSIS — X32XXXD Exposure to sunlight, subsequent encounter: Secondary | ICD-10-CM | POA: Diagnosis not present

## 2022-02-07 ENCOUNTER — Other Ambulatory Visit: Payer: Self-pay | Admitting: Nurse Practitioner

## 2022-02-08 NOTE — Telephone Encounter (Signed)
Chart supports Rx Last OV: 06/2021 Next OV: not scheduled, 30 day supply sent, pt needs f/u appt for further refills.

## 2022-03-18 DIAGNOSIS — L905 Scar conditions and fibrosis of skin: Secondary | ICD-10-CM | POA: Diagnosis not present

## 2022-03-18 DIAGNOSIS — Z1283 Encounter for screening for malignant neoplasm of skin: Secondary | ICD-10-CM | POA: Diagnosis not present

## 2022-03-18 DIAGNOSIS — D225 Melanocytic nevi of trunk: Secondary | ICD-10-CM | POA: Diagnosis not present

## 2022-09-16 DIAGNOSIS — Z1283 Encounter for screening for malignant neoplasm of skin: Secondary | ICD-10-CM | POA: Diagnosis not present

## 2022-09-16 DIAGNOSIS — B078 Other viral warts: Secondary | ICD-10-CM | POA: Diagnosis not present

## 2022-09-16 DIAGNOSIS — Z85828 Personal history of other malignant neoplasm of skin: Secondary | ICD-10-CM | POA: Diagnosis not present

## 2022-09-16 DIAGNOSIS — D225 Melanocytic nevi of trunk: Secondary | ICD-10-CM | POA: Diagnosis not present

## 2022-09-16 DIAGNOSIS — Z08 Encounter for follow-up examination after completed treatment for malignant neoplasm: Secondary | ICD-10-CM | POA: Diagnosis not present

## 2023-04-18 ENCOUNTER — Encounter: Payer: Self-pay | Admitting: Nurse Practitioner

## 2023-05-25 DIAGNOSIS — D2271 Melanocytic nevi of right lower limb, including hip: Secondary | ICD-10-CM | POA: Diagnosis not present

## 2023-05-25 DIAGNOSIS — Z1283 Encounter for screening for malignant neoplasm of skin: Secondary | ICD-10-CM | POA: Diagnosis not present

## 2023-05-25 DIAGNOSIS — X32XXXD Exposure to sunlight, subsequent encounter: Secondary | ICD-10-CM | POA: Diagnosis not present

## 2023-05-25 DIAGNOSIS — L570 Actinic keratosis: Secondary | ICD-10-CM | POA: Diagnosis not present

## 2023-05-25 DIAGNOSIS — D225 Melanocytic nevi of trunk: Secondary | ICD-10-CM | POA: Diagnosis not present

## 2023-08-31 ENCOUNTER — Encounter: Payer: Self-pay | Admitting: Nurse Practitioner
